# Patient Record
Sex: Male | Born: 1955 | Race: Black or African American | Hispanic: No | Marital: Married | Smoking: Current every day smoker
Health system: Southern US, Community
[De-identification: ages and names within clinical notes are randomized; demographics above are authoritative.]

## PROBLEM LIST (undated history)

## (undated) DIAGNOSIS — F32A Depression, unspecified: Secondary | ICD-10-CM

## (undated) DIAGNOSIS — F329 Major depressive disorder, single episode, unspecified: Secondary | ICD-10-CM

## (undated) DIAGNOSIS — K219 Gastro-esophageal reflux disease without esophagitis: Secondary | ICD-10-CM

---

## 2015-01-08 ENCOUNTER — Emergency Department: Payer: Self-pay

## 2015-01-08 ENCOUNTER — Emergency Department
Admission: EM | Admit: 2015-01-08 | Discharge: 2015-01-08 | Disposition: A | Discharge status: Home / Self Care | Payer: Self-pay | Attending: Emergency Medicine | Admitting: Emergency Medicine

## 2015-01-08 ENCOUNTER — Encounter: Payer: Self-pay | Admitting: Emergency Medicine

## 2015-01-08 DIAGNOSIS — Z0389 Encounter for observation for other suspected diseases and conditions ruled out: Secondary | ICD-10-CM | POA: Insufficient documentation

## 2015-01-08 DIAGNOSIS — Z79899 Other long term (current) drug therapy: Secondary | ICD-10-CM | POA: Insufficient documentation

## 2015-01-08 DIAGNOSIS — Z880 Allergy status to penicillin: Secondary | ICD-10-CM | POA: Insufficient documentation

## 2015-01-08 DIAGNOSIS — Z720 Tobacco use: Secondary | ICD-10-CM | POA: Insufficient documentation

## 2015-01-08 DIAGNOSIS — Z139 Encounter for screening, unspecified: Secondary | ICD-10-CM

## 2015-01-08 HISTORY — DX: Depression, unspecified: F32.A

## 2015-01-08 HISTORY — DX: Major depressive disorder, single episode, unspecified: F32.9

## 2015-01-08 HISTORY — DX: Gastro-esophageal reflux disease without esophagitis: K21.9

## 2015-01-08 NOTE — ED Notes (Signed)
Pt in NAD.  No breathing, swallowing difficulties.  Pt thinks that he may have swallowed the partial plate.

## 2015-01-08 NOTE — ED Notes (Signed)
Pt says he woke just pta and is missing his top partial; 3 teeth attached to the partial; thinks he's swallowed it; denies pain; says he and his wife "swept" their room and cannot find the partial; pt says partial has been falling out periodically and he puts it back in place with fixodent;

## 2015-01-08 NOTE — ED Provider Notes (Signed)
[facility] Emergency Department Provider Note  ____________________________________________  Time seen: Approximately 6:07 AM  I have reviewed the triage vital signs and the nursing notes.   HISTORY  Chief Complaint Foreign Body    HPI Thomas Garza is a 59 y.o. male who presents to the ED from home with a chief complaint of possible swallowed foreign body. States he took a sleeping pill prior to bed, thought he should take out his top partial plate; unsure if he actually removed it. Awoke in the night and could not find a partial. States both he and his wife searched his room and could not find it. Patient is concerned he swallowed his partial. Denies chest pain, shortness of breath, nausea, vomiting, diarrhea. Denies difficulty swallowing or drooling. Denies abdominal pain.   Past Medical History  Diagnosis Date  . Depression   . GERD (gastroesophageal reflux disease)     There are no active problems to display for this patient.   History reviewed. No pertinent past surgical history.  Current Outpatient Rx  Name  Route  Sig  Dispense  Refill  . pantoprazole (PROTONIX) 40 MG tablet   Oral   Take 40 mg by mouth daily.           Allergies Penicillins  History reviewed. No pertinent family history.  Social History Social History  Substance Use Topics  . Smoking status: Current Every Day Smoker -- 0.50 packs/day    Types: Cigarettes  . Smokeless tobacco: Never Used  . Alcohol Use: No    Review of Systems Constitutional: No fever/chills Eyes: No visual changes. ENT: No sore throat. Cardiovascular: Denies chest pain. Respiratory: Denies shortness of breath. Gastrointestinal: No abdominal pain.  No nausea, no vomiting.  No diarrhea.  No constipation. Genitourinary: Negative for dysuria. Musculoskeletal: Negative for back pain. Skin: Negative for rash. Neurological: Negative for headaches, focal weakness or numbness.  10-point ROS  otherwise negative.  ____________________________________________   PHYSICAL EXAM:  VITAL SIGNS: ED Triage Vitals  Enc Vitals Group     BP --      Pulse Rate 01/08/15 0421 91     Resp 01/08/15 0421 20     Temp 01/08/15 0421 97.6 F (36.4 C)     Temp Source 01/08/15 0421 Oral     SpO2 01/08/15 0421 97 %     Weight 01/08/15 0421 206 lb (93.441 kg)     Height 01/08/15 0421 5\' 11"  (1.803 m)     Head Cir --      Peak Flow --      Pain Score 01/08/15 0421 0     Pain Loc --      Pain Edu? --      Excl. in GC? --     Constitutional: Alert and oriented. Well appearing and in no acute distress. Jovial and laughing. Eyes: Conjunctivae are normal. PERRL. EOMI. Head: Atraumatic. Nose: No congestion/rhinnorhea. Mouth/Throat: Mucous membranes are moist.  Oropharynx non-erythematous. Neck: No stridor.   Cardiovascular: Normal rate, regular rhythm. Grossly normal heart sounds.  Good peripheral circulation. Respiratory: Normal respiratory effort.  No retractions. Lungs CTAB. Gastrointestinal: Soft and nontender. No distention. No abdominal bruits. No CVA tenderness. Musculoskeletal: No lower extremity tenderness nor edema.  No joint effusions. Neurologic:  Normal speech and language. No gross focal neurologic deficits are appreciated. No gait instability. Skin:  Skin is warm, dry and intact. No rash noted. Psychiatric: Mood and affect are normal. Speech and behavior are normal.  ____________________________________________  LABS (all labs ordered are listed, but only abnormal results are displayed)  Labs Reviewed - No data to display ____________________________________________  EKG  None ____________________________________________  RADIOLOGY  Chest 1 view (viewed by me, interpreted per Dr. Andria Meuse): No active disease.  KUB (view by me, interpreted per Dr. Andria Meuse): No radiopaque foreign bodies. Borderline gas-filled small bowel with fold thickening may indicate enteritis  or early obstruction. ____________________________________________   PROCEDURES  Procedure(s) performed: None  Critical Care performed: No  ____________________________________________   INITIAL IMPRESSION / ASSESSMENT AND PLAN / ED COURSE  Pertinent labs & imaging results that were available during my care of the patient were reviewed by me and considered in my medical decision making (see chart for details).  59 year old male who thinks he swallowed his top partial dental plate. Describes steel plate with 3 teeth attached. Chest and abdomen x-rays do not reveal ingested foreign body. Strict return precautions given. Patient verbalizes understanding and agrees with plan of care. ____________________________________________   FINAL CLINICAL IMPRESSION(S) / ED DIAGNOSES  Final diagnoses:  Encounter for medical screening examination      Irean Hong, MD 01/08/15 [phone]

## 2015-01-08 NOTE — Discharge Instructions (Signed)
There was no swallowed foreign body seen on your chest or abdominal x-rays. Please return to the ER for persistent vomiting, difficulty breathing, or other concerns.

## 2017-01-04 IMAGING — CR DG ABDOMEN 1V
1 series · 2 of 2 positions shown · non-contrast
Comparison: None.

CLINICAL DATA: Possible foreign body. Patient may have swallowed
partials.

EXAM:
ABDOMEN - 1 VIEW

[Series 1: dg abd 1 view · 0.14mm/px · 2 of 2 slices shown]
[im 1/2]
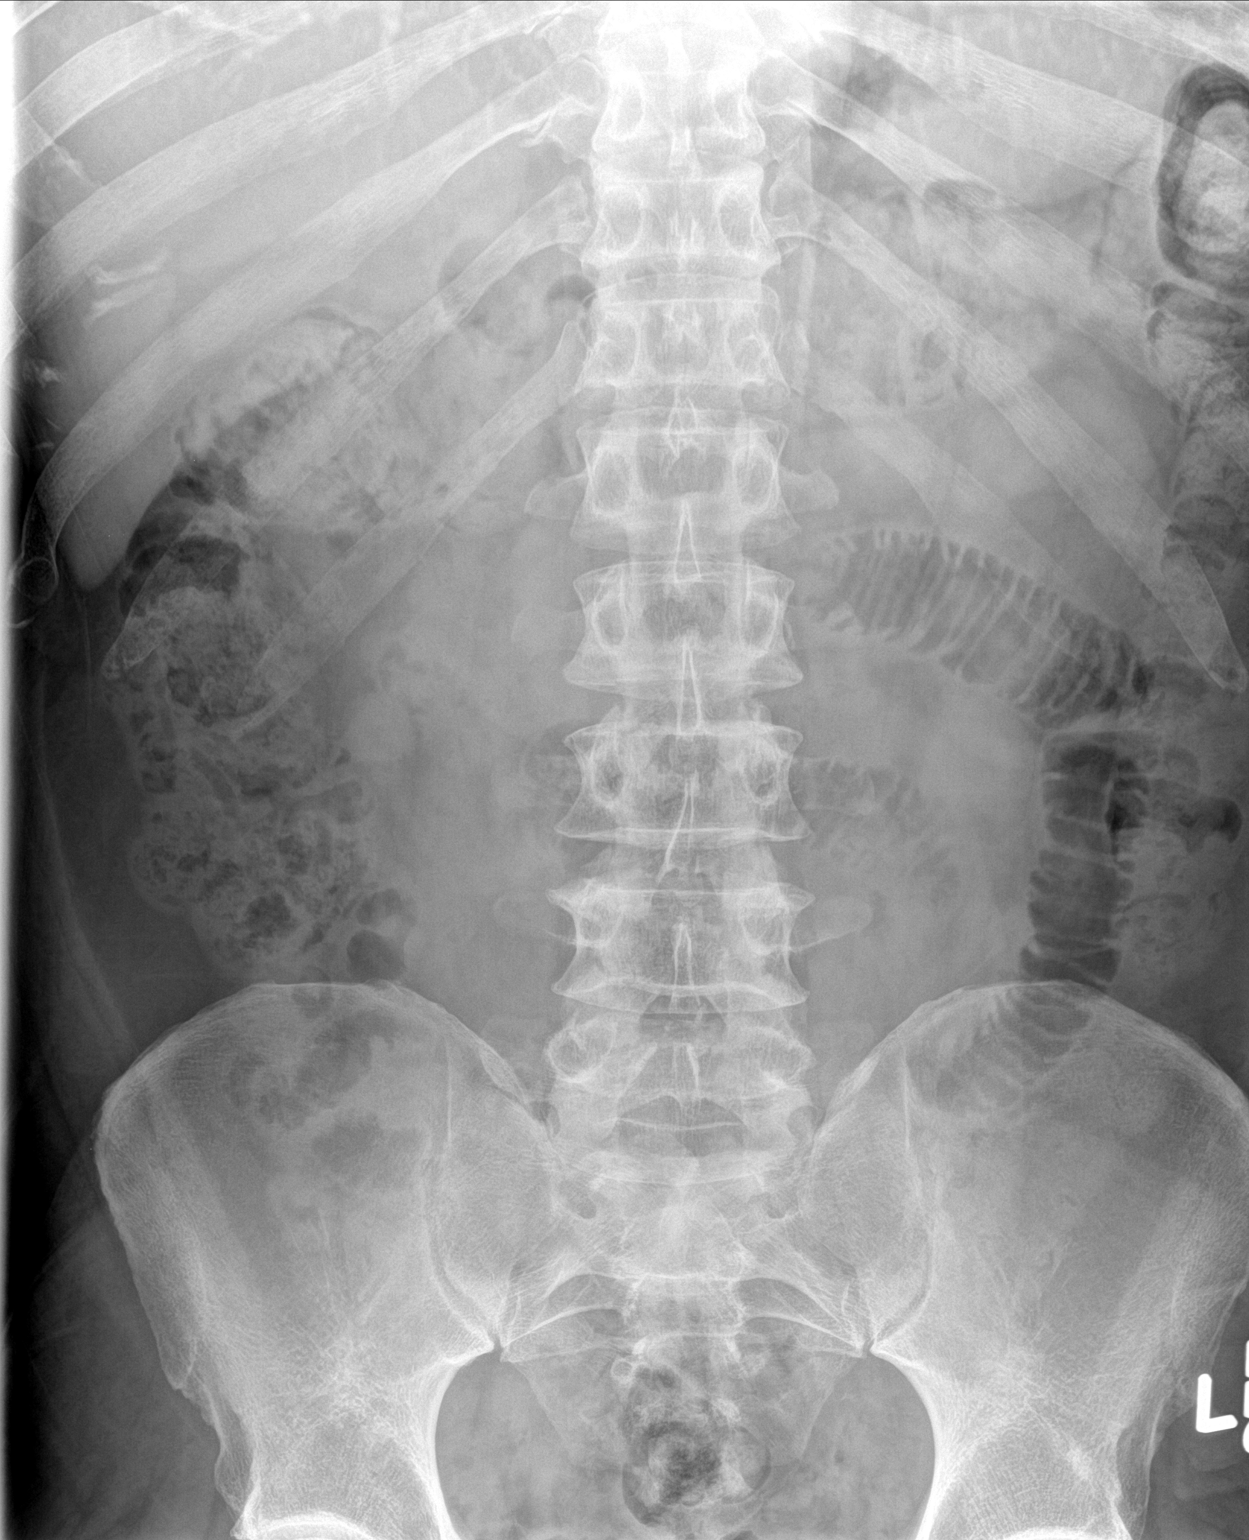
[im 2/2]
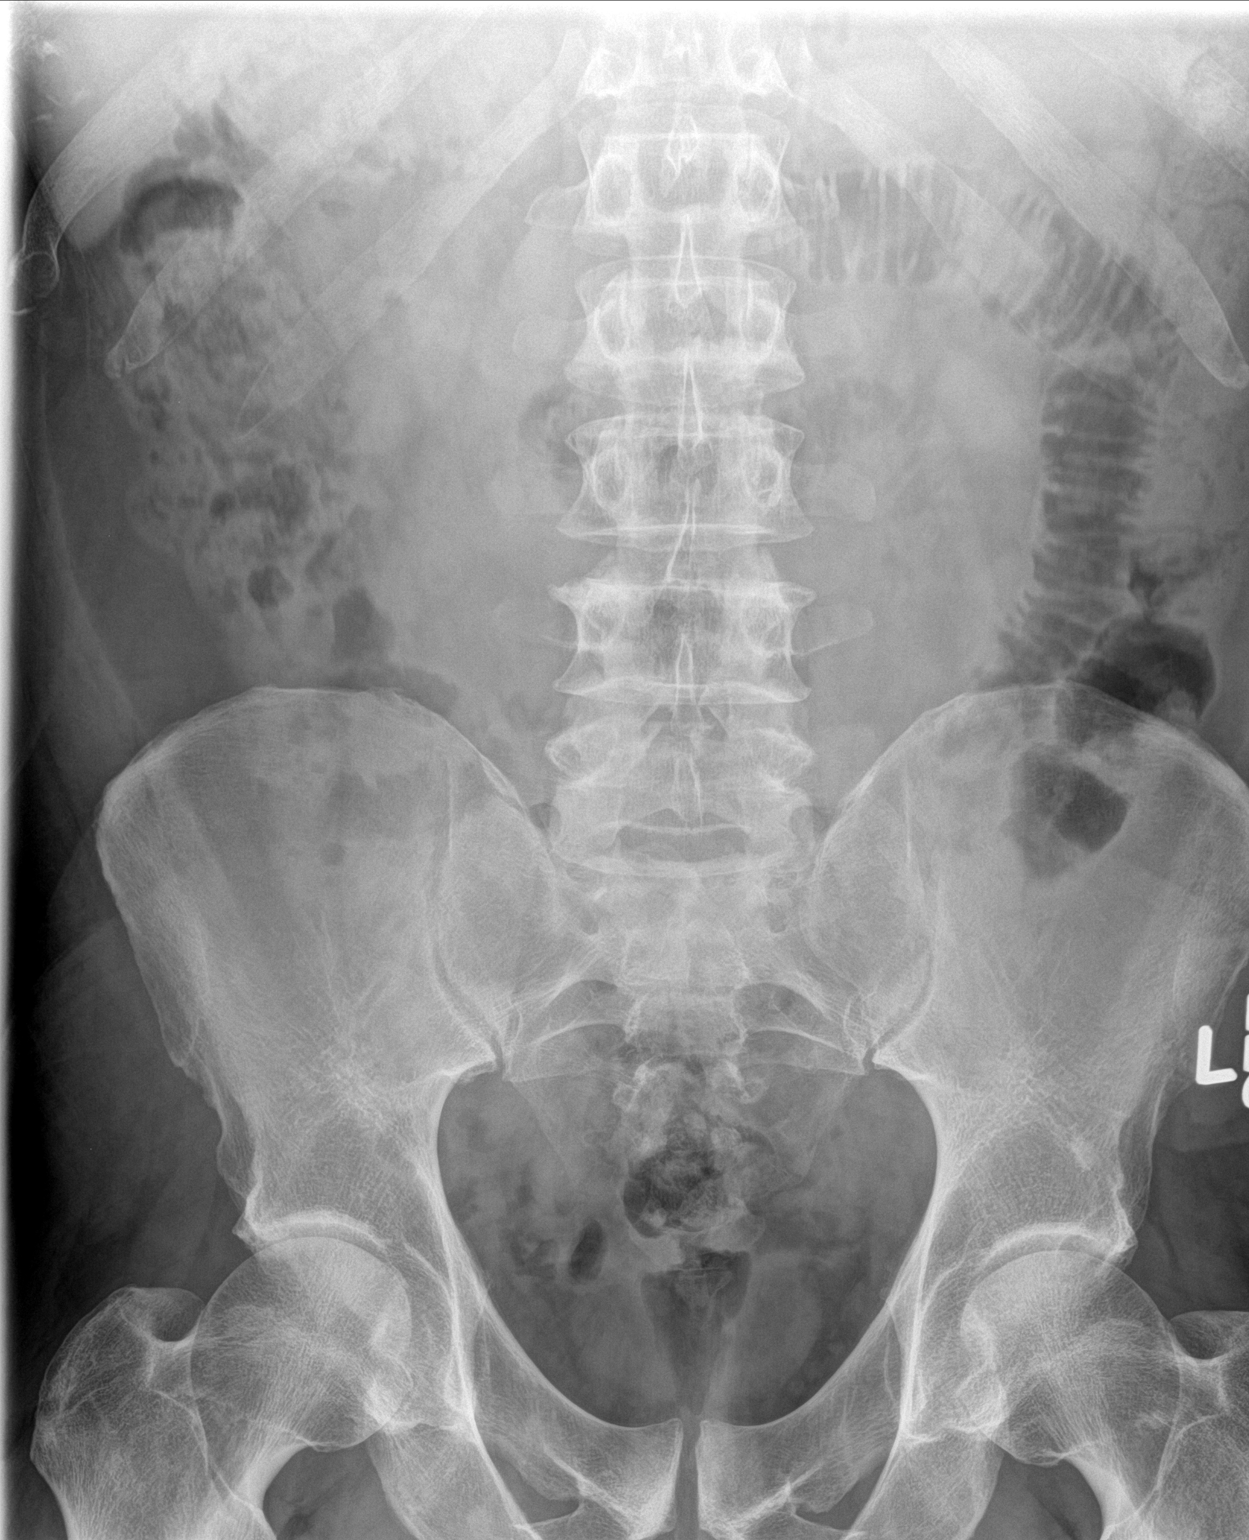

[2 of 2 positions shown; findings below may reference images not displayed]

FINDINGS: No radiopaque foreign bodies demonstrated. There are borderline
dilated gas-filled loops of small bowel in the left mid abdomen with
apparent fold thickening. Changes may represent enteritis or early
obstruction. Diffusely stool-filled colon. No colonic distention. No
radiopaque stones. Mild degenerative changes in the spine and hips.
IMPRESSION: No radiopaque foreign bodies. Borderline gas-filled small bowel with
fold thickening may indicate enteritis or early obstruction.

## 2017-01-04 IMAGING — CR DG CHEST 1V
1 series · 1 of 1 positions shown · non-contrast
Comparison: None.

CLINICAL DATA: Possible foreign body. Patient woke up this morning
and partials were missing from mouth. Patient may have swallowed
than.

EXAM:
CHEST 1 VIEW

[dg chest 1 view]
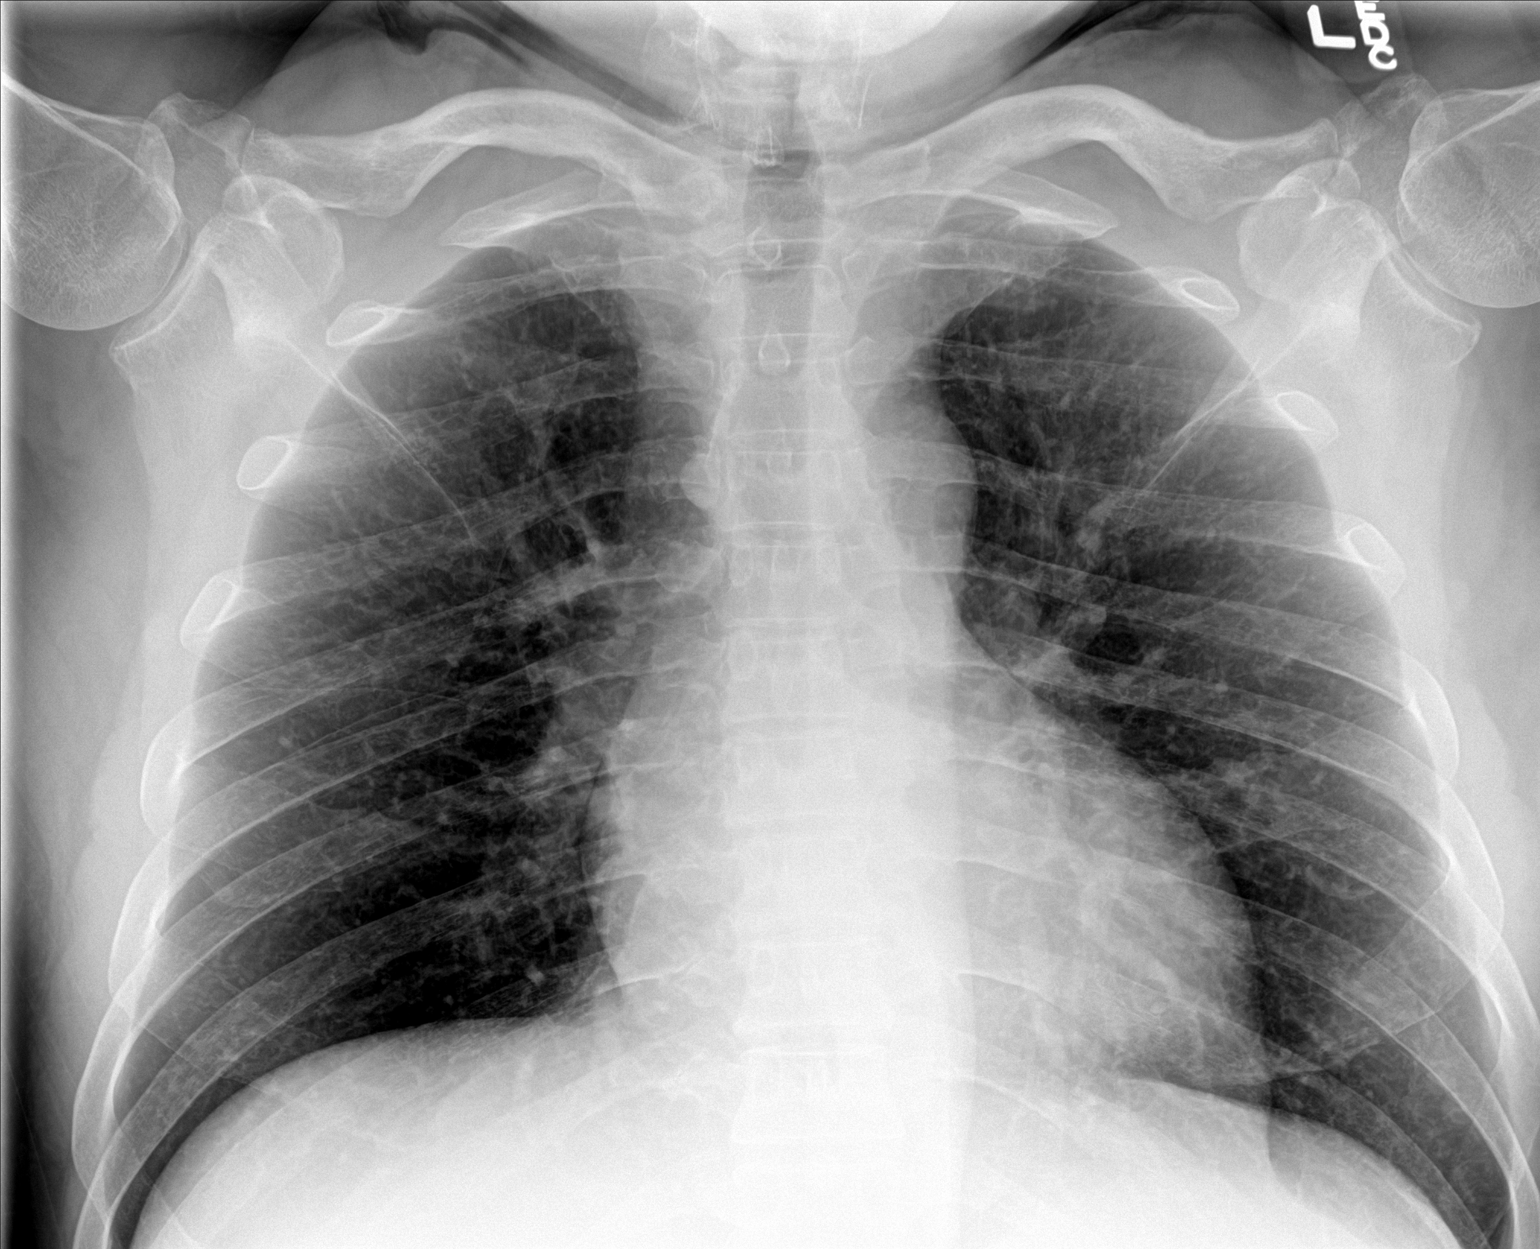

[1 of 1 positions shown; findings below may reference images not displayed]

FINDINGS: The heart size and mediastinal contours are within normal limits.
Both lungs are clear. The visualized skeletal structures are
unremarkable. No radiopaque foreign bodies demonstrated.
IMPRESSION: No active disease.

## 2024-04-27 ENCOUNTER — Ambulatory Visit: Payer: Self-pay

## 2024-04-27 DIAGNOSIS — N341 Nonspecific urethritis: Secondary | ICD-10-CM

## 2024-04-27 DIAGNOSIS — Z113 Encounter for screening for infections with a predominantly sexual mode of transmission: Secondary | ICD-10-CM

## 2024-04-27 LAB — GRAM STAIN

## 2024-04-27 MED ORDER — DOXYCYCLINE HYCLATE 100 MG PO TABS: 100.0000 mg | ORAL_TABLET | Freq: Two times a day (BID) | ORAL | Status: AC

## 2024-04-27 NOTE — Progress Notes (Signed)
 Pt is here for STD screening. Gram stain: WBC>2/hpf and IGND- absent. The patient was dispensed doxycyline 100 mg capsules 2x/day for 7 days. I provided counseling today regarding the medication, the side effects and when to call clinic. Patient was given the opportunity to ask questions for any clarifications. Questions answered. Brochure,condoms and contact card given. Wilkie Drought, RN.

## 2024-04-28 NOTE — Progress Notes (Signed)
 [facility]- Hep B specimen received without a label on tube.  [facility] was able to send additional blood off today 2/4. Will wait to see for determination on whether or not [facility] will accept it or not. Larraine JONELLE Novak, RN

## 2024-04-29 LAB — CHLAMYDIA/GC NAA, CONFIRMATION
Chlamydia trachomatis, NAA: NEGATIVE
Neisseria gonorrhoeae, NAA: NEGATIVE
